# Patient Record
Sex: Male | Born: 1990 | Race: Black or African American | Hispanic: No | Marital: Single | Smoking: Never smoker
Health system: Southern US, Community
[De-identification: ages and names within clinical notes are randomized; demographics above are authoritative.]

---

## 2009-09-11 ENCOUNTER — Emergency Department (HOSPITAL_COMMUNITY): Admission: EM | Admit: 2009-09-11 | Discharge: 2009-09-11 | Payer: Self-pay | Admitting: Emergency Medicine

## 2009-09-22 ENCOUNTER — Emergency Department (HOSPITAL_COMMUNITY): Admission: EM | Admit: 2009-09-22 | Discharge: 2009-09-22 | Payer: Self-pay | Admitting: Emergency Medicine

## 2009-09-26 ENCOUNTER — Emergency Department (HOSPITAL_COMMUNITY): Admission: EM | Admit: 2009-09-26 | Discharge: 2009-09-26 | Payer: Self-pay | Admitting: Emergency Medicine

## 2013-08-23 ENCOUNTER — Emergency Department (HOSPITAL_COMMUNITY)
Admission: EM | Admit: 2013-08-23 | Discharge: 2013-08-23 | Disposition: A | Discharge status: Home / Self Care | Payer: BC Managed Care – PPO | Attending: Emergency Medicine | Admitting: Emergency Medicine

## 2013-08-23 ENCOUNTER — Emergency Department (HOSPITAL_COMMUNITY): Payer: BC Managed Care – PPO

## 2013-08-23 ENCOUNTER — Encounter (HOSPITAL_COMMUNITY): Payer: Self-pay | Admitting: Emergency Medicine

## 2013-08-23 DIAGNOSIS — W219XXA Striking against or struck by unspecified sports equipment, initial encounter: Secondary | ICD-10-CM | POA: Insufficient documentation

## 2013-08-23 DIAGNOSIS — S022XXA Fracture of nasal bones, initial encounter for closed fracture: Secondary | ICD-10-CM | POA: Insufficient documentation

## 2013-08-23 DIAGNOSIS — Y9367 Activity, basketball: Secondary | ICD-10-CM | POA: Insufficient documentation

## 2013-08-23 DIAGNOSIS — Y92838 Other recreation area as the place of occurrence of the external cause: Secondary | ICD-10-CM

## 2013-08-23 DIAGNOSIS — Y9239 Other specified sports and athletic area as the place of occurrence of the external cause: Secondary | ICD-10-CM | POA: Insufficient documentation

## 2013-08-23 NOTE — Discharge Instructions (Signed)
Rest, Ice intermittently (in the first 24-48 hours).    Take up to 800mg  of ibuprofen (that is usually 4 over the counter pills)  3 times a day for 5 days. Take with food.  Do not hesitate to return to the Emergency Department for any new, worsening or concerning symptoms.   If you do not have a primary care doctor you can establish one at the   [facility]: [address] [place] [id number] [phone]  After you establish care. Let them know you were seen in the emergency room. They must obtain records for further management.    Nasal Fracture A nasal fracture is a break or crack in the bones of the nose. A minor break usually heals in a month. You often will receive black eyes from a nasal fracture. This is not a cause for concern. The black eyes will go away over 1 to 2 weeks.  DIAGNOSIS  Your caregiver may want to examine you if you are concerned about a fracture of the nose. X-rays of the nose may not show a nasal fracture even when one is present. Sometimes your caregiver must wait 1 to 5 days after the injury to re-check the nose for alignment and to take additional X-rays. Sometimes the caregiver must wait until the swelling has gone down. TREATMENT Minor fractures that have caused no deformity often do not require treatment. More serious fractures where bones are displaced may require surgery. This will take place after the swelling is gone. Surgery will stabilize and align the fracture. HOME CARE INSTRUCTIONS   Put ice on the injured area.  Put ice in a plastic bag.  Place a towel between your skin and the bag.  Leave the ice on for 15-20 minutes, 03-04 times a day.  Take medications as directed by your caregiver.  Only take over-the-counter or prescription medicines for pain, discomfort, or fever as directed by your caregiver.  If your nose starts bleeding, squeeze the soft parts of the nose against the center wall while you are sitting in an  upright position for 10 minutes.  Contact sports should be avoided for at least 3 to 4 weeks or as directed by your caregiver. SEEK MEDICAL CARE IF:  Your pain increases or becomes severe.  You continue to have nosebleeds.  The shape of your nose does not return to normal within 5 days.  You have pus draining from the nose. SEEK IMMEDIATE MEDICAL CARE IF:   You have bleeding from your nose that does not stop after 20 minutes of pinching the nostrils closed and keeping ice on the nose.  You have clear fluid draining from your nose.  You notice a grape-like swelling on the dividing wall between the nostrils (septum). This is a collection of blood (hematoma) that must be drained to help prevent infection.  You have difficulty moving your eyes.  You have recurrent vomiting. Document Released: 03/28/2000 Document Revised: 06/23/2011 Document Reviewed: 07/15/2010 [facility] Patient Information 2014 [employer] [place] [place].

## 2013-08-23 NOTE — ED Notes (Signed)
PT ambulated with baseline gait; VSS; A&Ox3; no signs of distress; respirations even and unlabored; skin warm and dry; no questions upon discharge.  

## 2013-08-23 NOTE — ED Notes (Signed)
Patient arrives from home with complaint of nose injury while playing basketball. Was struck by elbow during play. Nose was bleeding from both nares but has since stopped. Currently nose appears swollen and displaced to the left.

## 2013-08-23 NOTE — ED Provider Notes (Signed)
CSN: [id number]     Arrival date & time 08/23/13  2019 History   First MD Initiated Contact with Patient 08/23/13 2053     This chart was scribed for non-physician practitioner, Wynetta EmeryNicole Dalton Mille PA-C working with Juliet RudeNathan R. Rubin PayorPickering, MD by Arlan OrganAshley Leger, ED Scribe. This patient was seen in room TR04C/TR04C and the patient's care was started at 9:22 PM.   Chief Complaint  Patient presents with  . Facial Injury   The history is provided by the patient. No language interpreter was used.    HPI Comments: Derek MesaJustin Mory is a 23 y.o. male who presents to the Emergency Department complaining of a facial injury to the nose sustained around 6 PM this evening. Pt states he was playing basketball when he was struck in the nose by another players elbow. He now reports swelling to the nose along with minimal pain rated 4-5/10. After incident, pt admits to bleeding from both nares intermittently for about 2 hours, however, this has resolved. He has not tried anything OTC for his symptoms. He has no pertinent past medical history. No other concerns this visit.  History reviewed. No pertinent past medical history. History reviewed. No pertinent past surgical history. History reviewed. No pertinent family history. History  Substance Use Topics  . Smoking status: Never Smoker   . Smokeless tobacco: Not on file  . Alcohol Use: Yes     Comment: Occasional    Review of Systems  Constitutional: Negative for fever and chills.  HENT: Negative for congestion.   Eyes: Negative for redness.  Respiratory: Negative for cough.   Musculoskeletal: Positive for arthralgias (Nose).  Skin: Negative for rash.  Psychiatric/Behavioral: Negative for confusion.      Allergies  Review of patient's allergies indicates no known allergies.  Home Medications   Prior to Admission medications   Not on File   Triage Vitals: BP 126/82  Pulse 103  Temp(Src) 98.9 F (37.2 C) (Oral)  Resp 19  Ht 5' 10.5" (1.791 m)   Wt 173 lb (78.472 kg)  BMI 24.46 kg/m2  SpO2 100%   Physical Exam  Nursing note and vitals reviewed. Constitutional: He is oriented to person, place, and time. He appears well-developed and well-nourished. No distress.  HENT:  Head: Normocephalic.  Swelling and tenderness to palpation of the nasal bridge.  Septum is midline, there are no septal hematomas.  No tenderness to palpation along the orbital grams, extraocular movement intact without pain or diplopia.  No intraoral trauma  Eyes: Conjunctivae and EOM are normal. Pupils are equal, round, and reactive to light.  Neck: Normal range of motion. Neck supple.  Cardiovascular: Normal rate.   Pulmonary/Chest: Effort normal. No stridor.  Musculoskeletal: Normal range of motion.  Neurological: He is alert and oriented to person, place, and time.  Psychiatric: He has a normal mood and affect.    ED Course  Procedures (including critical care time)  DIAGNOSTIC STUDIES: Oxygen Saturation is 100% on RA, Normal by my interpretation.    COORDINATION OF CARE: 9:24 PM-Will order DG Nasal bones. Discussed treatment plan with pt at bedside and pt agreed to plan.     Labs Review Labs Reviewed - No data to display  Imaging Review No results found.   EKG Interpretation None      MDM   Final diagnoses:  Nasal bones, closed fracture    Filed Vitals:   08/23/13 2043 08/23/13 2046 08/23/13 2212  BP:  126/82 132/85  Pulse:  103 81  Temp:  98.9 F (37.2 C)   TempSrc:  Oral   Resp:  19 18  Height: 5' 10.5" (1.791 m)    Weight: 173 lb (78.472 kg)    SpO2:  100% 100%    Medications - No data to display  Derek MesaJustin Sullenberger is a 23 y.o. male presenting with nasal bridge pain and swelling after he was elbowed while paying basketball. X-ray shows nondisplaced nasal bone fractures. Advised patient to ice, and follow with ENT.  Evaluation does not show pathology that would require ongoing emergent intervention or inpatient  treatment. Pt is hemodynamically stable and mentating appropriately. Discussed findings and plan with patient/guardian, who agrees with care plan. All questions answered. Return precautions discussed and outpatient follow up given.  Note: Portions of this report may have been transcribed using voice recognition software. Every effort was made to ensure accuracy; however, inadvertent computerized transcription errors may be present   I personally performed the services described in this documentation, which was scribed in my presence. The recorded information has been reviewed and is accurate.    Wynetta Emeryicole Verdean Murin, PA-C 08/25/13 [phone]

## 2013-08-26 NOTE — ED Provider Notes (Signed)
Medical screening examination/treatment/procedure(s) were performed by non-physician practitioner and as supervising physician I was immediately available for consultation/collaboration.   EKG Interpretation None       Cyani Kallstrom R. Eriberto Felch, MD 08/26/13 1458 

## 2015-03-10 IMAGING — CR DG NASAL BONES 3+V
3 series · 3 of 3 positions shown · non-contrast
Comparison: None.

CLINICAL DATA: Traumatic injury with pain

EXAM:
NASAL BONES - 3+ VIEW

[w waters *]
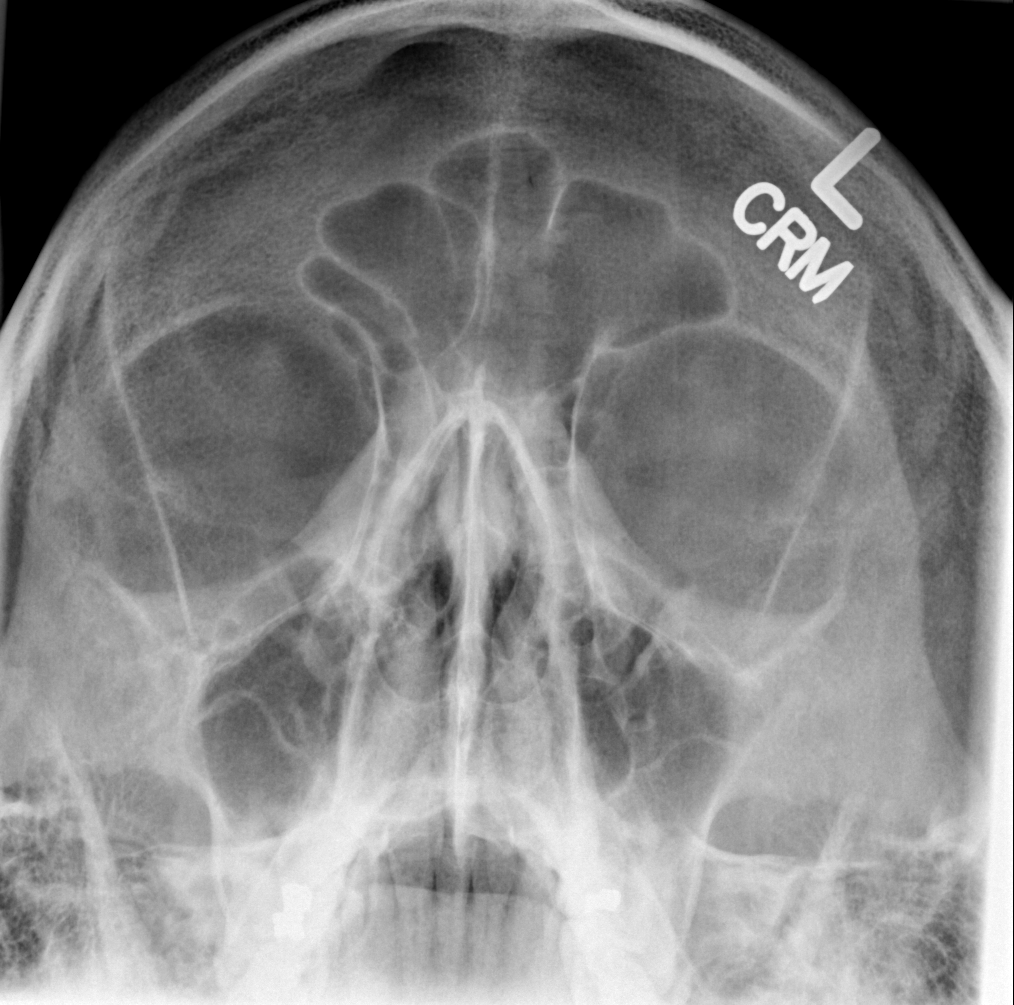

[w nasal bone lat * (1 of 2)]
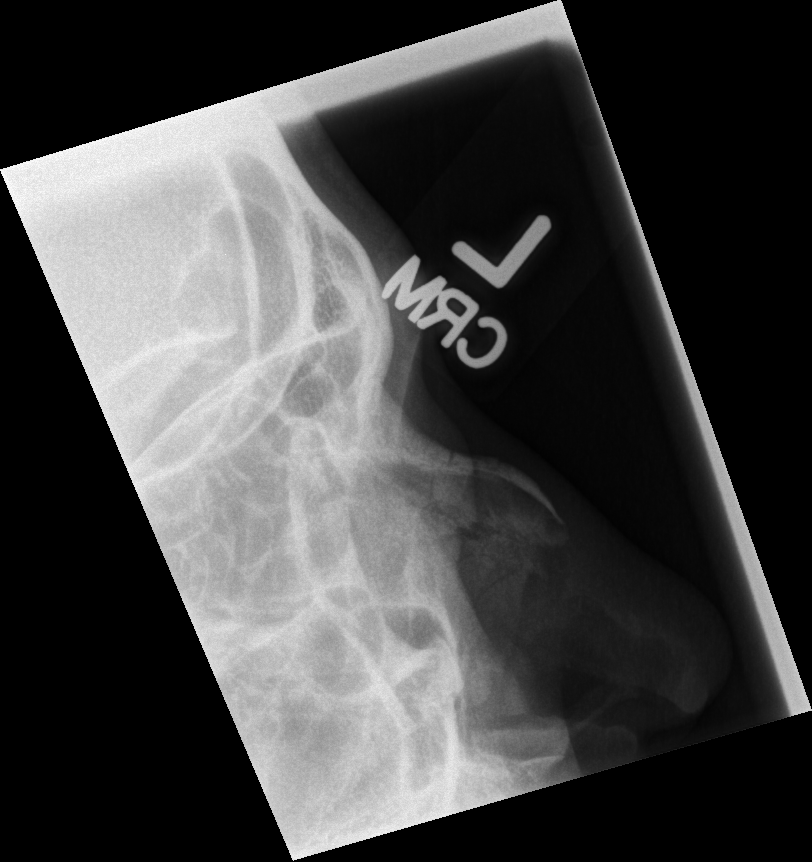

[w nasal bone lat * (2 of 2)]
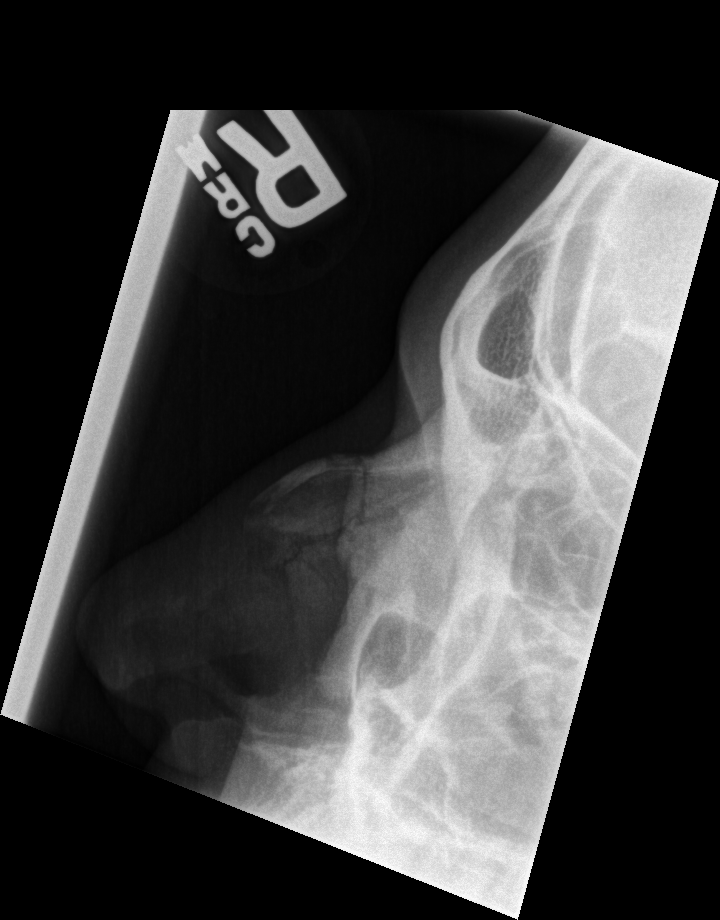

[3 of 3 positions shown; findings below may reference images not displayed]

FINDINGS: An undisplaced nasal bone fracture is noted. Mild soft tissue
swelling is seen. The anterior nasal spine is within normal limits.
The orbits and visualized paranasal sinuses are unremarkable.
IMPRESSION: Undisplaced nasal bone fractures
